# Patient Record
Sex: Female | Born: 1995 | Race: Black or African American | Hispanic: No | Marital: Single | Smoking: Current every day smoker
Health system: Southern US, Community
[De-identification: ages and names within clinical notes are randomized; demographics above are authoritative.]

## PROBLEM LIST (undated history)

## (undated) DIAGNOSIS — Z8041 Family history of malignant neoplasm of ovary: Secondary | ICD-10-CM

## (undated) DIAGNOSIS — J039 Acute tonsillitis, unspecified: Secondary | ICD-10-CM

## (undated) DIAGNOSIS — Z803 Family history of malignant neoplasm of breast: Secondary | ICD-10-CM

## (undated) HISTORY — DX: Family history of malignant neoplasm of ovary: Z80.41

## (undated) HISTORY — DX: Family history of malignant neoplasm of breast: Z80.3

---

## 2021-06-13 ENCOUNTER — Encounter (HOSPITAL_COMMUNITY): Payer: Self-pay

## 2021-06-13 ENCOUNTER — Emergency Department (HOSPITAL_COMMUNITY)
Admission: EM | Admit: 2021-06-13 | Discharge: 2021-06-14 | Disposition: A | Discharge status: Home / Self Care | Payer: BC Managed Care – PPO | Attending: Emergency Medicine | Admitting: Emergency Medicine

## 2021-06-13 ENCOUNTER — Other Ambulatory Visit: Payer: Self-pay

## 2021-06-13 DIAGNOSIS — R519 Headache, unspecified: Secondary | ICD-10-CM | POA: Insufficient documentation

## 2021-06-13 DIAGNOSIS — M25552 Pain in left hip: Secondary | ICD-10-CM | POA: Diagnosis not present

## 2021-06-13 DIAGNOSIS — M542 Cervicalgia: Secondary | ICD-10-CM | POA: Diagnosis not present

## 2021-06-13 DIAGNOSIS — Y92410 Unspecified street and highway as the place of occurrence of the external cause: Secondary | ICD-10-CM | POA: Insufficient documentation

## 2021-06-13 DIAGNOSIS — R0789 Other chest pain: Secondary | ICD-10-CM | POA: Insufficient documentation
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
## 2021-06-14 ENCOUNTER — Emergency Department (HOSPITAL_COMMUNITY): Payer: BC Managed Care – PPO

## 2021-06-14 ENCOUNTER — Encounter (HOSPITAL_COMMUNITY): Payer: Self-pay | Admitting: Student

## 2021-06-14 LAB — I-STAT BETA HCG BLOOD, ED (MC, WL, AP ONLY): I-stat hCG, quantitative: <5 m[IU]/mL (ref <5)

## 2021-06-14 MED ORDER — IBUPROFEN 800 MG PO TABS
800.0000 mg | ORAL_TABLET | Freq: Once | ORAL | Status: AC
  Administered 2021-06-14: 800 mg via ORAL
  Filled 2021-06-14: qty 1

## 2021-06-14 MED ORDER — METHOCARBAMOL 500 MG PO TABS: 500.0000 mg | ORAL_TABLET | Freq: Three times a day (TID) | ORAL | 0 refills | Status: DC | PRN

## 2021-06-14 MED ORDER — NAPROXEN 500 MG PO TABS: 500.0000 mg | ORAL_TABLET | Freq: Two times a day (BID) | ORAL | 0 refills | Status: DC | PRN
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
## 2021-08-08 ENCOUNTER — Encounter (HOSPITAL_COMMUNITY): Payer: Self-pay | Admitting: Emergency Medicine

## 2021-08-08 ENCOUNTER — Emergency Department (HOSPITAL_COMMUNITY)
Admission: EM | Admit: 2021-08-08 | Discharge: 2021-08-08 | Disposition: A | Discharge status: Home / Self Care | Payer: BC Managed Care – PPO | Attending: Emergency Medicine | Admitting: Emergency Medicine

## 2021-08-08 DIAGNOSIS — J020 Streptococcal pharyngitis: Secondary | ICD-10-CM | POA: Insufficient documentation

## 2021-08-08 DIAGNOSIS — J029 Acute pharyngitis, unspecified: Secondary | ICD-10-CM | POA: Diagnosis present

## 2021-08-08 DIAGNOSIS — Z20822 Contact with and (suspected) exposure to covid-19: Secondary | ICD-10-CM | POA: Insufficient documentation

## 2021-08-08 HISTORY — DX: Acute tonsillitis, unspecified: J03.90

## 2021-08-08 LAB — RESP PANEL BY RT-PCR (FLU A&B, COVID) ARPGX2
Influenza A by PCR: NEGATIVE
Influenza B by PCR: NEGATIVE
SARS Coronavirus 2 by RT PCR: NEGATIVE

## 2021-08-08 LAB — GROUP A STREP BY PCR: Group A Strep by PCR: DETECTED — AB

## 2021-08-08 MED ORDER — DEXAMETHASONE 1 MG/ML PO CONC
10.0000 mg | Freq: Once | ORAL | Status: AC
  Administered 2021-08-08: 10 mg via ORAL
  Filled 2021-08-08 (×2): qty 10

## 2021-08-08 MED ORDER — PENICILLIN G BENZATHINE 1200000 UNIT/2ML IM SUSY
1.2000 10*6.[IU] | PREFILLED_SYRINGE | Freq: Once | INTRAMUSCULAR | Status: AC
  Administered 2021-08-08: 1.2 10*6.[IU] via INTRAMUSCULAR
  Filled 2021-08-08: qty 2

## 2021-08-08 NOTE — ED Triage Notes (Signed)
Patient reports sore throat x2 days and runny nose since last night. Maintaining saliva and speaking in full sentences. ?

## 2021-08-08 NOTE — Discharge Instructions (Addendum)
You may alternate taking Tylenol and Naproxen as needed for pain control. You may take Naproxen twice daily as directed on your discharge paperwork and you may take  [phone] mg of Tylenol every 6 hours. Do not exceed 4000 mg of Tylenol daily as this can lead to liver damage. Also, make sure to take Naproxen with meals as it can cause an upset stomach. Do not take other NSAIDs while taking Naproxen such as (Aleve, Ibuprofen, Aspirin, Celebrex, etc) and do not take more than the prescribed dose as this can lead to ulcers and bleeding in your GI tract. ? ?Please follow up with your primary doctor within the next 7-10 days for re-evaluation and further treatment of your symptoms.  ? ?Please return to the ER sooner if you have any new or worsening symptoms. ? ?

## 2021-08-08 NOTE — ED Provider Notes (Signed)
?[facility]-EMERGENCY DEPT ?Provider Note ? ? ?CSN: [id number] ?Arrival date & time: 08/08/21  1524 ? ?  ? ?History ? ?Chief Complaint  ?Patient presents with  ? Sore Throat  ? ? ?Margaret Garcia is a 26 y.o. female. ? ?HPI ? ?26 year old female presents to the emergency department today for evaluation of a sore throat that started yesterday.  She also reports ports that she has some white patches to her tonsils.  She has a history of tonsillitis.  She is not rhinorrhea but denies any cough. ? ?Home Medications ?Prior to Admission medications   ?Medication Sig Start Date End Date Taking? Authorizing Provider  ?methocarbamol (ROBAXIN) 500 MG tablet Take 1 tablet (500 mg total) by mouth every 8 (eight) hours as needed for muscle spasms. 06/14/21   Petrucelli, Samantha R, PA-C  ?naproxen (NAPROSYN) 500 MG tablet Take 1 tablet (500 mg total) by mouth 2 (two) times daily as needed for moderate pain. 06/14/21   Petrucelli, Pleas Koch, PA-C  ?   ? ?Allergies    ?Patient has no known allergies.   ? ?Review of Systems   ?Review of Systems ?See HPI for pertinent positives or negatives. ? ? ?Physical Exam ?Updated Vital Signs ?BP (!) 123/93   Pulse 79   Temp 99.2 ?F (37.3 ?C) (Oral)   Resp 18   LMP 08/03/2021   SpO2 100%  ?Physical Exam ?Vitals and nursing note reviewed.  ?Constitutional:   ?   General: She is not in acute distress. ?   Appearance: She is well-developed.  ?HENT:  ?   Head: Normocephalic and atraumatic.  ?   Mouth/Throat:  ?   Pharynx: Uvula midline. Oropharyngeal exudate and posterior oropharyngeal erythema present.  ?   Tonsils: Tonsillar exudate present. No tonsillar abscesses. 2+ on the right. 2+ on the left.  ?Eyes:  ?   Conjunctiva/sclera: Conjunctivae normal.  ?Cardiovascular:  ?   Rate and Rhythm: Normal rate.  ?Pulmonary:  ?   Effort: Pulmonary effort is normal.  ?Musculoskeletal:     ?   General: Normal range of motion.  ?   Cervical back: Neck supple.  ?Skin: ?   General: Skin  is warm and dry.  ?Neurological:  ?   Mental Status: She is alert.  ? ? ? ?ED Results / Procedures / Treatments   ?Labs ?(all labs ordered are listed, but only abnormal results are displayed) ?Labs Reviewed  ?GROUP A STREP BY PCR - Abnormal; Notable for the following components:  ?    Result Value  ? Group A Strep by PCR DETECTED (*)   ? All other components within normal limits  ?RESP PANEL BY RT-PCR (FLU A&B, COVID) ARPGX2  ? ? ?EKG ?None ? ?Radiology ?No results found. ? ?Procedures ?Procedures  ? ? ?Medications Ordered in ED ?Medications  ?penicillin g benzathine (BICILLIN LA) 1200000 UNIT/2ML injection 1.2 Million Units (has no administration in time range)  ?dexamethasone (DECADRON) 1 MG/ML solution 10 mg (has no administration in time range)  ? ? ?ED Course/ Medical Decision Making/ A&P ?  ?                        ?Medical Decision Making ?Risk ?Prescription drug management. ? ? ?Pt febrile with tonsillar exudate, cervical lymphadenopathy, & dysphagia; diagnosis of strep. Covid/flu pending, pt will f/u on mychart. Treated in the Ed with steroids, and PCN IM.  Pt appears mildly dehydrated, discussed importance of water rehydration. Presentation  non concerning for PTA or infxn spread to soft tissue. No trismus or uvula deviation. Specific return precautions discussed. Pt able to drink water in ED without difficulty with intact air way. Recommended PCP follow up.  ? ? ? ?Final Clinical Impression(s) / ED Diagnoses ?Final diagnoses:  ?Strep throat  ? ? ?Rx / DC Orders ?ED Discharge Orders   ? ? None  ? ?  ? ? ?  ?Karrie Meres, PA-C ?08/08/21 1749 ? ?  ?Cheryll Cockayne, MD ?08/13/21 1911 ? ?

## 2022-07-16 ENCOUNTER — Other Ambulatory Visit: Payer: Self-pay

## 2022-07-16 ENCOUNTER — Emergency Department (HOSPITAL_COMMUNITY): Payer: BC Managed Care – PPO

## 2022-07-16 ENCOUNTER — Encounter (HOSPITAL_COMMUNITY): Payer: Self-pay

## 2022-07-16 ENCOUNTER — Emergency Department (HOSPITAL_COMMUNITY)
Admission: EM | Admit: 2022-07-16 | Discharge: 2022-07-16 | Disposition: A | Discharge status: Home / Self Care | Payer: BC Managed Care – PPO | Attending: Emergency Medicine | Admitting: Emergency Medicine

## 2022-07-16 DIAGNOSIS — N939 Abnormal uterine and vaginal bleeding, unspecified: Secondary | ICD-10-CM | POA: Diagnosis present

## 2022-07-16 DIAGNOSIS — R000 Tachycardia, unspecified: Secondary | ICD-10-CM | POA: Diagnosis not present

## 2022-07-16 LAB — BASIC METABOLIC PANEL
Anion gap: 7 (ref 5–15)
BUN: 12 mg/dL (ref 6–20)
CO2: 26 mmol/L (ref 22–32)
Calcium: 8.9 mg/dL (ref 8.9–10.3)
Chloride: 105 mmol/L (ref 98–111)
Creatinine, Ser: 0.9 mg/dL (ref 0.44–1.00)
GFR, Estimated: >60 mL/min (ref >60)
Glucose, Bld: 90 mg/dL (ref 70–99)
Potassium: 4 mmol/L (ref 3.5–5.1)
Sodium: 138 mmol/L (ref 135–145)

## 2022-07-16 LAB — CBC WITH DIFFERENTIAL/PLATELET
Abs Immature Granulocytes: 0.01 10*3/uL (ref 0.00–0.07)
Basophils Absolute: 0 10*3/uL (ref 0.0–0.1)
Basophils Relative: 0 %
Eosinophils Absolute: 0.2 10*3/uL (ref 0.0–0.5)
Eosinophils Relative: 3 %
HCT: 35.4 % — ABNORMAL LOW (ref 36.0–46.0)
Hemoglobin: 12 g/dL (ref 12.0–15.0)
Immature Granulocytes: 0 %
Lymphocytes Relative: 36 %
Lymphs Abs: 2.2 10*3/uL (ref 0.7–4.0)
MCH: 29.3 pg (ref 26.0–34.0)
MCHC: 33.9 g/dL (ref 30.0–36.0)
MCV: 86.3 fL (ref 80.0–100.0)
Monocytes Absolute: 0.6 10*3/uL (ref 0.1–1.0)
Monocytes Relative: 9 %
Neutro Abs: 3.2 10*3/uL (ref 1.7–7.7)
Neutrophils Relative %: 52 %
Platelets: 248 10*3/uL (ref 150–400)
RBC: 4.1 MIL/uL (ref 3.87–5.11)
RDW: 13.6 % (ref 11.5–15.5)
WBC: 6.1 10*3/uL (ref 4.0–10.5)
nRBC: 0 % (ref 0.0–0.2)

## 2022-07-16 LAB — URINALYSIS, ROUTINE W REFLEX MICROSCOPIC
Bacteria, UA: NONE SEEN
Bilirubin Urine: NEGATIVE
Glucose, UA: NEGATIVE mg/dL
Ketones, ur: NEGATIVE mg/dL
Leukocytes,Ua: NEGATIVE
Nitrite: NEGATIVE
Protein, ur: NEGATIVE mg/dL
Specific Gravity, Urine: 1.012 (ref 1.005–1.030)
pH: 7 (ref 5.0–8.0)

## 2022-07-16 LAB — I-STAT BETA HCG BLOOD, ED (MC, WL, AP ONLY): I-stat hCG, quantitative: <5 m[IU]/mL (ref <5)

## 2022-07-16 LAB — WET PREP, GENITAL
Clue Cells Wet Prep HPF POC: NONE SEEN
Sperm: NONE SEEN
Trich, Wet Prep: NONE SEEN
WBC, Wet Prep HPF POC: NONE SEEN — AB (ref <10)
Yeast Wet Prep HPF POC: NONE SEEN

## 2022-07-16 MED ORDER — ACETAMINOPHEN 500 MG PO TABS
1000.0000 mg | ORAL_TABLET | Freq: Once | ORAL | Status: AC
  Administered 2022-07-16: 1000 mg via ORAL
  Filled 2022-07-16: qty 2

## 2022-07-16 NOTE — Discharge Instructions (Addendum)
Please make an appointment with the women's health clinic that I have attached here for you to be reevaluated in the next few days regarding your recent ER visit and symptoms.  I have also attached a primary care provider for you become established in this area.  You may alternate every 6 hours as needed for pain between 1000 mg of Tylenol and 400 mg ibuprofen.  If symptoms worsen please return to ER.

## 2022-07-16 NOTE — ED Triage Notes (Signed)
Pt arrived via POV, c/o vaginal bleeding after having intercourse last night. C/o pelvic pain, concerned for ovarian cyst, states hx of same
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
## 2022-07-18 LAB — GC/CHLAMYDIA PROBE AMP (~~LOC~~) NOT AT ARMC
Chlamydia: NEGATIVE
Comment: NEGATIVE
Comment: NORMAL
Neisseria Gonorrhea: NEGATIVE
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
## 2023-05-13 ENCOUNTER — Other Ambulatory Visit: Payer: Self-pay

## 2023-05-13 ENCOUNTER — Emergency Department (HOSPITAL_COMMUNITY)
Admission: EM | Admit: 2023-05-13 | Discharge: 2023-05-14 | Disposition: A | Discharge status: Home / Self Care | Payer: BC Managed Care – PPO | Attending: Emergency Medicine | Admitting: Emergency Medicine

## 2023-05-13 ENCOUNTER — Encounter (HOSPITAL_COMMUNITY): Payer: Self-pay | Admitting: *Deleted

## 2023-05-13 DIAGNOSIS — B349 Viral infection, unspecified: Secondary | ICD-10-CM | POA: Diagnosis not present

## 2023-05-13 DIAGNOSIS — F17210 Nicotine dependence, cigarettes, uncomplicated: Secondary | ICD-10-CM | POA: Diagnosis not present

## 2023-05-13 DIAGNOSIS — R509 Fever, unspecified: Secondary | ICD-10-CM | POA: Diagnosis present

## 2023-05-13 DIAGNOSIS — Z1152 Encounter for screening for COVID-19: Secondary | ICD-10-CM | POA: Insufficient documentation

## 2023-05-13 LAB — RESP PANEL BY RT-PCR (RSV, FLU A&B, COVID)  RVPGX2
Influenza A by PCR: NEGATIVE
Influenza B by PCR: NEGATIVE
Resp Syncytial Virus by PCR: NEGATIVE
SARS Coronavirus 2 by RT PCR: NEGATIVE

## 2023-05-13 NOTE — ED Triage Notes (Signed)
Pt reports that she woke up around 1900 with a fevers. She has had sore throat, swelling in her eyes. Took goody powder for her symptoms. Migraine headache.

## 2023-05-14 LAB — GROUP A STREP BY PCR: Group A Strep by PCR: NOT DETECTED

## 2023-05-14 MED ORDER — ACETAMINOPHEN 325 MG PO TABS
650.0000 mg | ORAL_TABLET | Freq: Once | ORAL | Status: AC | PRN
  Administered 2023-05-14: 650 mg via ORAL
  Filled 2023-05-14: qty 2

## 2023-05-14 NOTE — Discharge Instructions (Signed)
You were evaluated in the Emergency Department and after careful evaluation, we did not find any emergent condition requiring admission or further testing in the hospital.  Your exam/testing today is overall reassuring.  Strep test was negative.  Symptoms likely due to a viral illness.  Recommend Tylenol or Motrin for discomfort, plenty of fluids and rest at home.  Please return to the Emergency Department if you experience any worsening of your condition.   Thank you for allowing [place] to be a part of your care.

## 2023-05-14 NOTE — ED Provider Notes (Signed)
[facility]-EMERGENCY DEPT [facility] Emergency Department Provider Note MRN:  [id number]  Arrival date & time: 05/14/23     Chief Complaint   Fever   History of Present Illness   Margaret Garcia is a 27 y.o. year-old female with no pertinent past medical history presenting to the ED with chief complaint of fever.  Fever on and off today.  Associated with sore throat, body aches, chills.  No headache, no cough, no abdominal pain, no burning with urination.  Review of Systems  A thorough review of systems was obtained and all systems are negative except as noted in the HPI and PMH.   Patient's Health History    Past Medical History:  Diagnosis Date   Tonsillitis     History reviewed. No pertinent surgical history.  History reviewed. No pertinent family history.  Social History   Socioeconomic History   Marital status: Single    Spouse name: Not on file   Number of children: Not on file   Years of education: Not on file   Highest education level: Not on file  Occupational History   Not on file  Tobacco Use   Smoking status: Every Day    Types: Cigarettes, E-cigarettes   Smokeless tobacco: Never  Substance and Sexual Activity   Alcohol use: Yes    Comment: OCC   Drug use: Never   Sexual activity: Not on file  Other Topics Concern   Not on file  Social History Narrative   Not on file   Social Drivers of Health   Financial Resource Strain: Not on file  Food Insecurity: No Food Insecurity (08/19/2020)   Received from [facility], [facility]   Hunger Vital Sign    Worried About Running Out of Food in the Last Year: Never true    Ran Out of Food in the Last Year: Never true  Transportation Needs: Not on file  Physical Activity: Not on file  Stress: Not on file  Social Connections: Unknown (09/26/2021)   Received from [facility], [facility]   Social Network    Social Network: Not on file  Intimate Partner Violence: Unknown (08/18/2021)   Received from  [facility], [facility]   HITS    Physically Hurt: Not on file    Insult or Talk Down To: Not on file    Threaten Physical Harm: Not on file    Scream or Curse: Not on file     Physical Exam   Vitals:   05/13/23 2345 05/14/23 0156  BP: 124/82 (!) 124/90  Pulse: (!) 114 (!) 119  Resp: 18 18  Temp: (!) 100.8 F (38.2 C) 98.3 F (36.8 C)  SpO2: 100% 98%    CONSTITUTIONAL: Well-appearing, NAD NEURO/PSYCH:  Alert and oriented x 3, no focal deficits EYES:  eyes equal and reactive ENT/NECK:  no LAD, no JVD CARDIO: Regular rate, well-perfused, normal S1 and S2 PULM:  CTAB no wheezing or rhonchi GI/GU:  non-distended, non-tender MSK/SPINE:  No gross deformities, no edema SKIN:  no rash, atraumatic   *Additional and/or pertinent findings included in MDM below  Diagnostic and Interventional Summary    EKG Interpretation Date/Time:    Ventricular Rate:    PR Interval:    QRS Duration:    QT Interval:    QTC Calculation:   R Axis:      Text Interpretation:         Labs Reviewed  RESP PANEL BY RT-PCR (RSV, FLU A&B, COVID)  [id number]  GROUP A STREP BY PCR    No orders to display    Medications  acetaminophen (TYLENOL) tablet 650 mg (650 mg Oral Given 05/14/23 0024)     Procedures  /  Critical Care Procedures  ED Course and Medical Decision Making  Initial Impression and Ddx Well-appearing in no acute distress, mild erythema to the posterior oropharynx.  Question viral illness versus strep.  Past medical/surgical history that increases complexity of ED encounter: None  Interpretation of Diagnostics COVID flu RSV and strep negative  Patient Reassessment and Ultimate Disposition/Management     Discharge  Patient management required discussion with the following services or consulting groups:  None  Complexity of Problems Addressed Acute illness or injury that poses threat of life of bodily function  Additional Data Reviewed and Analyzed Further  history obtained from: None  Additional Factors Impacting ED Encounter Risk None  Elmer Sow. Pilar Plate, MD [facility] Emergency Medicine [facility] [email or web address]  Final Clinical Impressions(s) / ED Diagnoses     ICD-10-CM   1. Viral illness  B34.9       ED Discharge Orders     None        Discharge Instructions Discussed with and Provided to Patient:     Discharge Instructions      You were evaluated in the Emergency Department and after careful evaluation, we did not find any emergent condition requiring admission or further testing in the hospital.  Your exam/testing today is overall reassuring.  Strep test was negative.  Symptoms likely due to a viral illness.  Recommend Tylenol or Motrin for discomfort, plenty of fluids and rest at home.  Please return to the Emergency Department if you experience any worsening of your condition.   Thank you for allowing [place] to be a part of your care.       Sabas Sous, MD 05/14/23 [phone]

## 2023-05-17 ENCOUNTER — Encounter (HOSPITAL_COMMUNITY): Payer: Self-pay | Admitting: *Deleted

## 2023-05-17 ENCOUNTER — Emergency Department (HOSPITAL_COMMUNITY): Payer: BC Managed Care – PPO

## 2023-05-17 ENCOUNTER — Emergency Department (HOSPITAL_COMMUNITY)
Admission: EM | Admit: 2023-05-17 | Discharge: 2023-05-18 | Disposition: A | Discharge status: Home / Self Care | Payer: BC Managed Care – PPO | Attending: Emergency Medicine | Admitting: Emergency Medicine

## 2023-05-17 ENCOUNTER — Other Ambulatory Visit: Payer: Self-pay

## 2023-05-17 DIAGNOSIS — B349 Viral infection, unspecified: Secondary | ICD-10-CM | POA: Insufficient documentation

## 2023-05-17 DIAGNOSIS — Z20822 Contact with and (suspected) exposure to covid-19: Secondary | ICD-10-CM | POA: Insufficient documentation

## 2023-05-17 DIAGNOSIS — R509 Fever, unspecified: Secondary | ICD-10-CM | POA: Diagnosis present

## 2023-05-17 LAB — CBC WITH DIFFERENTIAL/PLATELET
Abs Immature Granulocytes: 0.03 10*3/uL (ref 0.00–0.07)
Basophils Absolute: 0 10*3/uL (ref 0.0–0.1)
Basophils Relative: 0 %
Eosinophils Absolute: 0.2 10*3/uL (ref 0.0–0.5)
Eosinophils Relative: 2 %
HCT: 38 % (ref 36.0–46.0)
Hemoglobin: 13.1 g/dL (ref 12.0–15.0)
Immature Granulocytes: 0 %
Lymphocytes Relative: 19 %
Lymphs Abs: 1.9 10*3/uL (ref 0.7–4.0)
MCH: 29 pg (ref 26.0–34.0)
MCHC: 34.5 g/dL (ref 30.0–36.0)
MCV: 84.1 fL (ref 80.0–100.0)
Monocytes Absolute: 1.3 10*3/uL — ABNORMAL HIGH (ref 0.1–1.0)
Monocytes Relative: 13 %
Neutro Abs: 6.5 10*3/uL (ref 1.7–7.7)
Neutrophils Relative %: 66 %
Platelets: 244 10*3/uL (ref 150–400)
RBC: 4.52 MIL/uL (ref 3.87–5.11)
RDW: 13.6 % (ref 11.5–15.5)
WBC: 10 10*3/uL (ref 4.0–10.5)
nRBC: 0 % (ref 0.0–0.2)

## 2023-05-17 LAB — COMPREHENSIVE METABOLIC PANEL
ALT: 16 U/L (ref 0–44)
AST: 18 U/L (ref 15–41)
Albumin: 3.7 g/dL (ref 3.5–5.0)
Alkaline Phosphatase: 55 U/L (ref 38–126)
Anion gap: 8 (ref 5–15)
BUN: 9 mg/dL (ref 6–20)
CO2: 24 mmol/L (ref 22–32)
Calcium: 9.2 mg/dL (ref 8.9–10.3)
Chloride: 100 mmol/L (ref 98–111)
Creatinine, Ser: 0.85 mg/dL (ref 0.44–1.00)
GFR, Estimated: >60 mL/min (ref >60)
Glucose, Bld: 73 mg/dL (ref 70–99)
Potassium: 3.8 mmol/L (ref 3.5–5.1)
Sodium: 132 mmol/L — ABNORMAL LOW (ref 135–145)
Total Bilirubin: 0.6 mg/dL (ref 0.0–1.2)
Total Protein: 8 g/dL (ref 6.5–8.1)

## 2023-05-17 LAB — URINALYSIS, ROUTINE W REFLEX MICROSCOPIC
Bilirubin Urine: NEGATIVE
Glucose, UA: NEGATIVE mg/dL
Hgb urine dipstick: NEGATIVE
Ketones, ur: NEGATIVE mg/dL
Leukocytes,Ua: NEGATIVE
Nitrite: NEGATIVE
Protein, ur: NEGATIVE mg/dL
Specific Gravity, Urine: 1.009 (ref 1.005–1.030)
pH: 6 (ref 5.0–8.0)

## 2023-05-17 LAB — RESP PANEL BY RT-PCR (RSV, FLU A&B, COVID)  RVPGX2
Influenza A by PCR: NEGATIVE
Influenza B by PCR: NEGATIVE
Resp Syncytial Virus by PCR: NEGATIVE
SARS Coronavirus 2 by RT PCR: NEGATIVE

## 2023-05-17 LAB — GROUP A STREP BY PCR: Group A Strep by PCR: NOT DETECTED

## 2023-05-17 LAB — LIPASE, BLOOD: Lipase: 28 U/L (ref 11–51)
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
## 2023-05-18 MED ORDER — BENZONATATE 100 MG PO CAPS: 100.0000 mg | ORAL_CAPSULE | Freq: Three times a day (TID) | ORAL | 0 refills | Status: DC

## 2023-05-18 MED ORDER — AZITHROMYCIN 250 MG PO TABS: 250.0000 mg | ORAL_TABLET | Freq: Every day | ORAL | 0 refills | Status: DC

## 2023-05-18 MED ORDER — ALBUTEROL SULFATE HFA 108 (90 BASE) MCG/ACT IN AERS
2.0000 | INHALATION_SPRAY | RESPIRATORY_TRACT | Status: DC | PRN
  Filled 2023-05-18: qty 6.7

## 2023-05-18 NOTE — ED Provider Notes (Signed)
 [facility] Emergency Department Provider Note MRN:  [id number]  Arrival date & time: 05/18/23     Chief Complaint   Fever   History of Present Illness   Margaret Garcia is a 28 y.o. year-old female presents to the ED with chief complaint of fever, chills, cough, sore throat.  Symptoms have been going on for about a week now.  She reports no improvement despite taking OTC meds.  This is her second visit for the same.  She stats she had asthma as a child.  Denies using an inhaler now.  States that she has had some rattling in her chest.  History provided by patient.   Review of Systems  Pertinent positive and negative review of systems noted in HPI.    Physical Exam   Vitals:   05/17/23 2156 05/18/23 0116  BP: (!) 133/94 (!) 132/96  Pulse: (!) 101 89  Resp: 18 16  Temp: 98.8 F (37.1 C) 98.2 F (36.8 C)  SpO2: 100% 100%    CONSTITUTIONAL:  non toxic-appearing, NAD NEURO:  Alert and oriented x 3, CN 3-12 grossly intact EYES:  eyes equal and reactive ENT/NECK:  Supple, no stridor  CARDIO:  normal rate on my exam, regular rhythm, appears well-perfused  PULM:  No respiratory distress, mild end expiratory wheezing on left GI/GU:  non-distended,  MSK/SPINE:  No gross deformities, no edema, moves all extremities  SKIN:  no rash, atraumatic   *Additional and/or pertinent findings included in MDM below  Diagnostic and Interventional Summary    EKG Interpretation Date/Time:    Ventricular Rate:    PR Interval:    QRS Duration:    QT Interval:    QTC Calculation:   R Axis:      Text Interpretation:         Labs Reviewed  CBC WITH DIFFERENTIAL/PLATELET - Abnormal; Notable for the following components:      Result Value   Monocytes Absolute 1.3 (*)    All other components within normal limits  COMPREHENSIVE METABOLIC PANEL - Abnormal; Notable for the following components:   Sodium 132 (*)    All other components within normal limits   RESP PANEL BY RT-PCR (RSV, FLU A&B, COVID)  RVPGX2  GROUP A STREP BY PCR  LIPASE, BLOOD  URINALYSIS, ROUTINE W REFLEX MICROSCOPIC    DG Chest 2 View  Final Result      Medications  albuterol  (VENTOLIN  HFA) 108 (90 Base) MCG/ACT inhaler 2 puff (has no administration in time range)     Procedures  /  Critical Care Procedures  ED Course and Medical Decision Making  I have reviewed the triage vital signs, the nursing notes, and pertinent available records from the EMR.  Social Determinants Affecting Complexity of Care: Patient has no clinically significant social determinants affecting this chief complaint..   ED Course:    Medical Decision Making Patient here with URI symptoms, cough, and some minor wheezing.  She has still been running a fever.  COVID, flu, RSV are negative.  CXR negative for pneumonia.  Strep is negative.  Patient doesn't appear toxic.  She has normal voice.  No respiratory distress.    ---------------------------------------------              05/17/23         05/18/23                     2156  0116        ---------------------------------------------  BP:        (!) 133/94       (!) 132/96      Pulse:       (!) 101            89          Resp:          18               16          Temp:   98.8 F (37.1 C)98.2 F (36.8 C)  SpO2:         100%             100%        ---------------------------------------------  Will give inhaler, tessalon , and trial z-pak given length of illness.  Amount and/or Complexity of Data Reviewed Radiology: ordered.  Risk Prescription drug management.         Consultants: No consultations were needed in caring for this patient.   Treatment and Plan: Emergency department workup does not suggest an emergent condition requiring admission or immediate intervention beyond  what has been performed at this time. The patient is safe for discharge and has  been instructed to  return immediately for worsening symptoms, change in  symptoms or any other concerns    Final Clinical Impressions(s) / ED Diagnoses     ICD-10-CM   1. Viral illness  B34.9       ED Discharge Orders          Ordered    azithromycin  (ZITHROMAX ) 250 MG tablet  Daily        05/18/23 0339    benzonatate  (TESSALON ) 100 MG capsule  Every 8 hours        05/18/23 9660              Discharge Instructions Discussed with and Provided to Patient:   Discharge Instructions   None      Vicky Charleston, PA-C 05/18/23 0348    Bari Charmaine FALCON, MD 05/18/23 [phone]

## 2023-08-26 ENCOUNTER — Ambulatory Visit

## 2023-08-26 ENCOUNTER — Ambulatory Visit
Admission: EM | Admit: 2023-08-26 | Discharge: 2023-08-26 | Disposition: A | Discharge status: Home / Self Care | Attending: Family Medicine | Admitting: Family Medicine

## 2023-08-26 DIAGNOSIS — M25561 Pain in right knee: Secondary | ICD-10-CM

## 2023-08-26 DIAGNOSIS — M7041 Prepatellar bursitis, right knee: Secondary | ICD-10-CM | POA: Diagnosis not present

## 2023-08-26 MED ORDER — METHYLPREDNISOLONE 4 MG PO TBPK: ORAL_TABLET | ORAL | 0 refills | Status: AC

## 2023-08-26 NOTE — Discharge Instructions (Signed)
 Limit walking while knee is painful Use crutches until you can walk without limp Ice for 20 minutes 2-3 times a day Take the Medrol Dosepak as directed.  Take all of day 1 today See orthopedics or sports medicine if you fail to improve over the next week
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
## 2023-12-27 ENCOUNTER — Other Ambulatory Visit: Payer: Self-pay | Admitting: Family Medicine

## 2023-12-27 DIAGNOSIS — N643 Galactorrhea not associated with childbirth: Secondary | ICD-10-CM

## 2023-12-29 ENCOUNTER — Other Ambulatory Visit

## 2024-01-08 ENCOUNTER — Ambulatory Visit
Admission: RE | Admit: 2024-01-08 | Discharge: 2024-01-08 | Disposition: A | Discharge status: Home / Self Care | Source: Ambulatory Visit | Attending: Family Medicine | Admitting: Family Medicine

## 2024-01-08 DIAGNOSIS — N643 Galactorrhea not associated with childbirth: Secondary | ICD-10-CM

## 2024-01-10 ENCOUNTER — Other Ambulatory Visit: Payer: Self-pay

## 2024-01-10 DIAGNOSIS — N6452 Nipple discharge: Secondary | ICD-10-CM

## 2024-01-11 ENCOUNTER — Other Ambulatory Visit: Payer: Self-pay | Admitting: Family Medicine

## 2024-01-11 DIAGNOSIS — Z1231 Encounter for screening mammogram for malignant neoplasm of breast: Secondary | ICD-10-CM

## 2024-02-15 ENCOUNTER — Ambulatory Visit

## 2024-03-09 ENCOUNTER — Emergency Department (HOSPITAL_COMMUNITY)
Admission: EM | Admit: 2024-03-09 | Discharge: 2024-03-09 | Disposition: A | Discharge status: Home / Self Care | Attending: Emergency Medicine | Admitting: Emergency Medicine

## 2024-03-09 ENCOUNTER — Emergency Department (HOSPITAL_COMMUNITY)

## 2024-03-09 DIAGNOSIS — R0600 Dyspnea, unspecified: Secondary | ICD-10-CM

## 2024-03-09 DIAGNOSIS — R509 Fever, unspecified: Secondary | ICD-10-CM

## 2024-03-09 DIAGNOSIS — B349 Viral infection, unspecified: Secondary | ICD-10-CM | POA: Insufficient documentation

## 2024-03-09 LAB — COMPREHENSIVE METABOLIC PANEL WITH GFR
ALT: 19 U/L (ref 0–44)
AST: 23 U/L (ref 15–41)
Albumin: 4.4 g/dL (ref 3.5–5.0)
Alkaline Phosphatase: 72 U/L (ref 38–126)
Anion gap: 12 (ref 5–15)
BUN: 8 mg/dL (ref 6–20)
CO2: 22 mmol/L (ref 22–32)
Calcium: 9.7 mg/dL (ref 8.9–10.3)
Chloride: 102 mmol/L (ref 98–111)
Creatinine, Ser: 0.94 mg/dL (ref 0.44–1.00)
GFR, Estimated: >60 mL/min (ref >60)
Glucose, Bld: 99 mg/dL (ref 70–99)
Potassium: 4 mmol/L (ref 3.5–5.1)
Sodium: 136 mmol/L (ref 135–145)
Total Bilirubin: 1.1 mg/dL (ref 0.0–1.2)
Total Protein: 8 g/dL (ref 6.5–8.1)

## 2024-03-09 LAB — RESP PANEL BY RT-PCR (RSV, FLU A&B, COVID)  RVPGX2
Influenza A by PCR: NEGATIVE
Influenza B by PCR: NEGATIVE
Resp Syncytial Virus by PCR: NEGATIVE
SARS Coronavirus 2 by RT PCR: NEGATIVE

## 2024-03-09 LAB — CBC WITH DIFFERENTIAL/PLATELET
Abs Immature Granulocytes: 0.03 K/uL (ref 0.00–0.07)
Basophils Absolute: 0 K/uL (ref 0.0–0.1)
Basophils Relative: 0 %
Eosinophils Absolute: 0.2 K/uL (ref 0.0–0.5)
Eosinophils Relative: 2 %
HCT: 39.6 % (ref 36.0–46.0)
Hemoglobin: 13.1 g/dL (ref 12.0–15.0)
Immature Granulocytes: 0 %
Lymphocytes Relative: 9 %
Lymphs Abs: 0.8 K/uL (ref 0.7–4.0)
MCH: 27.6 pg (ref 26.0–34.0)
MCHC: 33.1 g/dL (ref 30.0–36.0)
MCV: 83.5 fL (ref 80.0–100.0)
Monocytes Absolute: 0.4 K/uL (ref 0.1–1.0)
Monocytes Relative: 5 %
Neutro Abs: 7.7 K/uL (ref 1.7–7.7)
Neutrophils Relative %: 84 %
Platelets: 254 K/uL (ref 150–400)
RBC: 4.74 MIL/uL (ref 3.87–5.11)
RDW: 13.8 % (ref 11.5–15.5)
WBC: 9.1 K/uL (ref 4.0–10.5)
nRBC: 0 % (ref 0.0–0.2)

## 2024-03-09 LAB — HCG, SERUM, QUALITATIVE: Preg, Serum: NEGATIVE

## 2024-03-09 MED ORDER — KETOROLAC TROMETHAMINE 15 MG/ML IJ SOLN
15.0000 mg | Freq: Once | INTRAMUSCULAR | Status: AC
  Administered 2024-03-09: 15 mg via INTRAVENOUS
  Filled 2024-03-09: qty 1

## 2024-03-09 MED ORDER — ACETAMINOPHEN 325 MG PO TABS
650.0000 mg | ORAL_TABLET | Freq: Once | ORAL | Status: AC
Start: 2024-03-09 — End: 2024-03-09
  Administered 2024-03-09: 650 mg via ORAL
  Filled 2024-03-09: qty 2

## 2024-03-09 MED ORDER — ALBUTEROL SULFATE (2.5 MG/3ML) 0.083% IN NEBU
2.5000 mg | INHALATION_SOLUTION | Freq: Once | RESPIRATORY_TRACT | Status: AC
  Administered 2024-03-09: 2.5 mg via RESPIRATORY_TRACT
  Filled 2024-03-09: qty 3

## 2024-03-09 MED ORDER — SODIUM CHLORIDE 0.9 % IV BOLUS
1000.0000 mL | Freq: Once | INTRAVENOUS | Status: AC
  Administered 2024-03-09: 1000 mL via INTRAVENOUS

## 2024-03-09 MED ORDER — ALBUTEROL SULFATE HFA 108 (90 BASE) MCG/ACT IN AERS: 1.0000 | INHALATION_SPRAY | Freq: Four times a day (QID) | RESPIRATORY_TRACT | 0 refills | Status: AC | PRN

## 2024-03-09 NOTE — ED Triage Notes (Signed)
 C/o SOB with 'rattle' while breathing. Chest pain with coughing. Pain in sinus area. C/o chills, difficulty breathing when lying down. Symptoms started around 10pm last night. Took Nyquil. No tylenol  today.

## 2024-03-09 NOTE — Discharge Instructions (Addendum)
 Return for any problem.   Drink plenty of fluids, use Tylenol  and ibuprofen  for fever.  Use albuterol  as needed for bronchospasm.  If you are using the albuterol  more than every 4 hours you should return to the ED for reevaluation.

## 2024-03-09 NOTE — ED Provider Notes (Signed)
 [place] EMERGENCY DEPARTMENT AT [facility] Provider Note   CSN: [id number] Arrival date & time: 03/09/24  1225     Patient presents with: Fever and Shortness of Breath   Margaret Garcia is a 29 y.o. female.   28 year old female with prior medical history as detailed below presents for evaluation.  Patient reports roughly 24 hours of cough, congestion, fever, chills.  Patient's symptoms began yesterday afternoon into the evening.  She took some NyQuil last night.  She did not take anything for her symptoms today.  She denies nausea or vomiting.  She denies abdominal pain or chest pain.  The history is provided by the patient and medical records.       Prior to Admission medications   Medication Sig Start Date End Date Taking? Authorizing Provider  diphenhydrAMINE HCl (BENADRYL ALLERGY PO) Take by mouth.    [provider]  methylPREDNISolone  (MEDROL  DOSEPAK) 4 MG TBPK tablet tad 08/26/23   Maranda Jamee Jacob, MD    Allergies: Patient has no known allergies.    Review of Systems  All other systems reviewed and are negative.   Updated Vital Signs BP 125/87 (BP Location: Right Arm)   Pulse (!) 126   Temp 99.8 F (37.7 C) (Oral)   Resp (!) 22   LMP 02/09/2024 (Exact Date)   SpO2 94%   Physical Exam Vitals and nursing note reviewed.  Constitutional:      General: She is not in acute distress.    Appearance: She is well-developed.  HENT:     Head: Normocephalic and atraumatic.  Eyes:     Conjunctiva/sclera: Conjunctivae normal.  Cardiovascular:     Rate and Rhythm: Regular rhythm. Tachycardia present.     Heart sounds: No murmur heard. Pulmonary:     Effort: Pulmonary effort is normal. No respiratory distress.     Breath sounds: Normal breath sounds.  Abdominal:     Palpations: Abdomen is soft.     Tenderness: There is no abdominal tenderness.  Musculoskeletal:        General: No swelling.     Cervical back: Neck supple.  Skin:     General: Skin is warm and dry.     Capillary Refill: Capillary refill takes less than 2 seconds.  Neurological:     Mental Status: She is alert.  Psychiatric:        Mood and Affect: Mood normal.     (all labs ordered are listed, but only abnormal results are displayed) Labs Reviewed  RESP PANEL BY RT-PCR (RSV, FLU A&B, COVID)  RVPGX2  CBC WITH DIFFERENTIAL/PLATELET  HCG, SERUM, QUALITATIVE  COMPREHENSIVE METABOLIC PANEL WITH GFR    EKG: None  Radiology: DG Chest 2 View Result Date: 03/09/2024 CLINICAL DATA:  Fever, cough and shortness of breath. EXAM: DG CHEST 2V COMPARISON:  05/17/2023 FINDINGS: The cardiac silhouette, mediastinal and hilar contours are normal. There is mild peribronchial thickening and slight increased interstitial markings suggesting bronchitis. No infiltrates, edema or effusions. No pulmonary lesions or pneumothorax. The bony thorax is intact. IMPRESSION: Findings suggest bronchitis or interstitial pneumonitis. No pulmonary infiltrates or pleural effusions. Electronically Signed   By: MYRTIS Stammer M.D.   On: 03/09/2024 13:20     Procedures   Medications Ordered in the ED  sodium chloride 0.9 % bolus 1,000 mL (has no administration in time range)  acetaminophen  (TYLENOL ) tablet 650 mg (650 mg Oral Given 03/09/24 1236)  sodium chloride 0.9 % bolus 1,000 mL (1,000 mLs Intravenous  New Bag/Given 03/09/24 1321)  ketorolac (TORADOL) 15 MG/ML injection 15 mg (15 mg Intravenous Given 03/09/24 1322)                                    Medical Decision Making Patient presents with fever, malaise, fatigue.  Symptoms are consistent with likely viral syndrome.  With IV fluids, antipyretics patient feels much improved.  Patient's x-ray does not suggest pneumonia.  Labs obtained are reassuringly without significant acute abnormality.  Patient without clear wheezing on exam.  However, she requested albuterol .  After treatment no bronchospasm appreciated.  Patient  prescribed course of albuterol  MDI to use as needed if she felt that it was indicated at home.  Importance of close follow-up is stressed.  Strict return precautions given and understood.  Amount and/or Complexity of Data Reviewed Labs: ordered. Radiology: ordered.  Risk OTC drugs. Prescription drug management.        Final diagnoses:  Dyspnea, unspecified type  Viral illness  Fever, unspecified fever cause    ED Discharge Orders          Ordered    albuterol  (VENTOLIN  HFA) 108 (90 Base) MCG/ACT inhaler  Every 6 hours PRN        03/09/24 1427               Laurice Maude BROCKS, MD 03/09/24 1441

## 2024-03-09 NOTE — ED Notes (Signed)
 Discharge instructions, medications, and follow up care reviewed with and provided to pt. Pt denies any further questions, and has verbalized understanding.

## 2024-03-29 ENCOUNTER — Inpatient Hospital Stay: Attending: Hematology

## 2024-03-29 ENCOUNTER — Inpatient Hospital Stay

## 2024-03-29 DIAGNOSIS — Z1379 Encounter for other screening for genetic and chromosomal anomalies: Secondary | ICD-10-CM | POA: Diagnosis present

## 2024-03-29 DIAGNOSIS — Z8041 Family history of malignant neoplasm of ovary: Secondary | ICD-10-CM | POA: Insufficient documentation

## 2024-03-29 DIAGNOSIS — Z803 Family history of malignant neoplasm of breast: Secondary | ICD-10-CM | POA: Insufficient documentation

## 2024-03-29 DIAGNOSIS — F17210 Nicotine dependence, cigarettes, uncomplicated: Secondary | ICD-10-CM | POA: Diagnosis not present

## 2024-03-30 DIAGNOSIS — Z803 Family history of malignant neoplasm of breast: Secondary | ICD-10-CM | POA: Insufficient documentation

## 2024-03-30 DIAGNOSIS — Z8041 Family history of malignant neoplasm of ovary: Secondary | ICD-10-CM | POA: Insufficient documentation

## 2024-04-01 NOTE — Progress Notes (Signed)
 REFERRING PROVIDER: Vanderbilt Ned, MD [address] [place],  [place] [address]  PRIMARY PROVIDER:  Pcp, No  PRIMARY REASON FOR VISIT:  1. Family history of breast cancer   2. Family history of ovarian cancer     HISTORY OF PRESENT ILLNESS:   Margaret Garcia, a 28 y.o. female, was seen on 03/29/2024  for a [place] cancer genetics consultation at the request of Dr. Vanderbilt due to a family history of breast cancer.  Margaret Garcia presents to clinic today to discuss the possibility of a hereditary predisposition to cancer, genetic testing, and to further clarify her future cancer risks, as well as potential cancer risks for family members.   CANCER HISTORY:   Margaret Garcia is a 28 y.o. female with no personal history of cancer.    RELEVANT MEDICAL HISTORY AND RISK FACTORS:  Menarche was at age 38.  OCP use for approximately 6 months Ovaries intact: yes.  Hysterectomy: no.  Menopausal status: premenopausal.  HRT use: 0 years. Mammogram within the last year: had a breast ultrasound on 01/08/2024 due to left nipple discharge. Dr. Ananias recommended a mammogram and MRI, and Margaret Garcia reported they were waiting until after the genetic counseling appointment to schedule this.  Number of breast biopsies: 0. Up to date with pelvic exams: yes. Colonoscopy: no; no indication. Any excessive radiation exposure in the past: no Other cancer screenings: no.  Exposures: no.   Past Medical History:  Diagnosis Date   Family history of breast cancer    Family history of ovarian cancer    Tonsillitis     No past surgical history on file.  Social History   Socioeconomic History   Marital status: Single    Spouse name: Not on file   Number of children: Not on file   Years of education: Not on file   Highest education level: Not on file  Occupational History   Not on file  Tobacco Use   Smoking status: Every Day    Types: Cigarettes, E-cigarettes   Smokeless tobacco: Never   Vaping Use   Vaping status: Every Day  Substance and Sexual Activity   Alcohol use: Yes    Comment: OCC   Drug use: Never   Sexual activity: Not on file  Other Topics Concern   Not on file  Social History Narrative   Not on file   Social Drivers of Health   Financial Resource Strain: Not on file  Food Insecurity: No Food Insecurity (08/19/2020)   Received from [facility]   Hunger Vital Sign    Within the past 12 months, you worried that your food would run out before you got the money to buy more.: Never true    Within the past 12 months, the food you bought just didn't last and you didn't have money to get more.: Never true  Transportation Needs: Not on file  Physical Activity: Not on file  Stress: Not on file  Social Connections: Not on file     FAMILY HISTORY:  We obtained a detailed, 4-generation family history.  Significant diagnoses are listed below: Family History  Problem Relation Age of Onset   Breast cancer Maternal Grandmother 40       recurrence at age 43   Breast cancer Other        mulitiple maternal great aunts with breast cancer   Breast cancer Other        reports multiple maternal great uncles with breast cancer  Ovarian cancer Other    Breast cancer Maternal Great-grandmother     Margaret Garcia reports the following family history. Her maternal grandmother was diagnosed with breast cancer at age 56 and had a it recur at age 72. She is unsure if genetic testing was ever performed.  She reports her maternal grandmother had 6 total siblings and 5 out of the 6 siblings had breast cancer, both females and males but the distribution of males and females is unknown.  She reports her maternal grandmother had breast cancer. She reports other maternal great aunts (sisters from her maternal grandfather) had breast cancer and another had ovarian cancer. She does not report a paternal family history of cancer.  She reports her mother has not had genetic  testing. Margaret Garcia gave me verbal permission to speak to her mother, Margaret Garcia, about her health. I called Margaret Garcia and she confirmed that she has not had genetic testing but is going to have it done in [place], [place] where she lives. She says she is waiting to get a call from them to schedule her testing.   Margaret Garcia reports her maternal aunt had genetic testing and believes she tested negative.   There is no reported Ashkenazi Jewish ancestry. There is no known consanguinity.  GENETIC COUNSELING ASSESSMENT:  Margaret Garcia is a 28 y.o. female with a family history of breast cancer which is somewhat suggestive of a hereditary cancer syndrome and predisposition to cancer given this history. We, therefore, discussed and recommended the following at today's visit.   DISCUSSION: We discussed that, in general, most cancer is not inherited in families, but instead is sporadic or familial. Sporadic cancers occur by chance and typically happen at older ages (>50 years) as this type of cancer is caused by genetic changes acquired during an individual's lifetime. Some families have more cancers than would be expected by chance; however, the ages or types of cancer are not consistent with a known genetic mutation or known genetic mutations have been ruled out. This type of familial cancer is thought to be due to a combination of multiple genetic, environmental, hormonal, and lifestyle factors. While this combination of factors likely increases the risk of cancer, the exact source of this risk is not currently identifiable or testable.  We discussed that 5 - 10% of breast cancer is hereditary. Most cases of hereditary breast cancer are associated with BRCA1 and BRCA2 genes, although there are other genes associated with hereditary breast cancer as well. Cancer risks and management strategies are gene specific. We discussed that genetic testing can beneficial for several reasons, including clarifying specific  cancer risks, identifying potential screening and risk-reduction options that may be appropriate, and to understand if other family members could be at risk for cancer and allow them to undergo genetic testing to clarify their cancer risks.  We discussed that Margaret Garcia's mother is the best person to test given her closer relation to the affected individual with breast cancer (the maternal grandmother). If her mother tests negative, we would not recommend any genetic testing for Margaret Garcia due to the maternal family history being genetically unexplained and a lack of paternal cancer family history. If her mother tests positive, Margaret Garcia would have a 50% chance of having the same genetic mutation and genetic testing would be recommended. While her mother's genetic testing is pending, we discussed running a statistical model to determine her lifetime risk for breast cancer.   The Tyrer-Cuzick model is one of multiple prediction models  developed to estimate an individual's lifetime risk of developing breast cancer. The Tyrer-Cuzick model is endorsed by the [employer] (NCCN). This model includes many risk factors such as family history, endogenous estrogen exposure, and benign breast disease. The calculation is highly-dependent on the accuracy of clinical data provided by the patient and can change over time. The Tyrer-Cuzick model may be repeated to reflect new information in her personal or family history in the future.   Based on the patient's family history, a statistical model [employer] (medical) ) was used to estimate her risk of developing breast cancer. This estimates her lifetime risk of developing breast cancer to be approximately 20.8%. The patient's lifetime breast cancer risk is a preliminary estimate based on available information using one of several models endorsed by the American Cancer Society (ACS). The ACS recommends consideration of breast MRI screening as an  adjunct to mammography for patients at high risk (defined as 20% or greater lifetime risk). Please note that a woman's breast cancer risk changes over time. It may increase or decrease based on age and any changes to the personal and/or family medical history. The risks and recommendations listed above apply to this patient at this point in time. In the future, she may or may not be eligible for the same medical management strategies and, in some cases, other medical management strategies may become available to her. If she is interested in an updated breast cancer risk assessment at a later date, she can contact us .    Margaret Garcia has been determined to be at high risk for breast cancer.  her Tyrer-Cuzick risk score is 20.8%.  For women with a greater than 20% lifetime risk of breast cancer, the [employer] (NCCN) recommends the following:  1.      Clinical encounter every 6-12 months to begin when identified as being at increased risk, but not before age 37  2.      Annual mammograms. Tomosynthesis is recommended starting 10 years earlier than the youngest breast cancer diagnosis in the family or at age 55 (whichever comes first), but not before age 62   12.      Annual breast MRI starting 10 years earlier than the youngest breast cancer diagnosis in the family or at age 75 (whichever comes first), but not before age 75.    PLAN: After considering the risks, benefits, and limitations, Margaret Garcia declined genetic testing today. Due to the lifetime risk of breast cancer being greater than 20%, a referral to the high risk breast clinic was made for a consultation. She requested the high risk breast clinic/Dr. Gelene office call her mother to schedule this appointment.  RESOURCES PROVIDED:  Margaret Garcia was provided with the following:  [facility] Contact card   Lastly, we encouraged Margaret Garcia to remain in contact with cancer genetics annually so  that we can continuously update the family history and inform her of any changes in cancer genetics and testing that may be of benefit for this family.   Margaret Garcia questions were answered to her satisfaction today. Our contact information was provided should additional questions or concerns arise. Thank you for the referral and allowing us  to share in the care of your patient.   Jeny Nield R. Bluford, MS, [facility] Certified [email or web address] phone: [phone]  I personally spent a total of 50 minutes in the care of the patient today including preparing to see the patient, getting/reviewing separately obtained history, counseling  and educating, referring and communicating with other health care professionals, and documenting clinical information in the EHR.  The patient was seen alone.  Drs. Lanny Stalls, and/or Gudena were available for questions, if needed.   _______________________________________________________________________ For Office Staff:  Number of people involved in session: 1 Was an Intern/ student involved with case: no

## 2024-04-09 ENCOUNTER — Other Ambulatory Visit: Payer: Self-pay | Admitting: Surgery

## 2024-04-09 DIAGNOSIS — Z1231 Encounter for screening mammogram for malignant neoplasm of breast: Secondary | ICD-10-CM
